# Patient Record
Sex: Female | Born: 1965 | Race: White | Hispanic: No | Smoking: Current every day smoker
Health system: Southern US, Community
[De-identification: ages and names within clinical notes are randomized; demographics above are authoritative.]

## PROBLEM LIST (undated history)

## (undated) DIAGNOSIS — I4901 Ventricular fibrillation: Secondary | ICD-10-CM

## (undated) DIAGNOSIS — R0602 Shortness of breath: Secondary | ICD-10-CM

## (undated) DIAGNOSIS — I2510 Atherosclerotic heart disease of native coronary artery without angina pectoris: Secondary | ICD-10-CM

## (undated) DIAGNOSIS — Z888 Allergy status to other drugs, medicaments and biological substances status: Secondary | ICD-10-CM

## (undated) DIAGNOSIS — J301 Allergic rhinitis due to pollen: Secondary | ICD-10-CM

## (undated) DIAGNOSIS — I10 Essential (primary) hypertension: Secondary | ICD-10-CM

## (undated) DIAGNOSIS — I6529 Occlusion and stenosis of unspecified carotid artery: Secondary | ICD-10-CM

## (undated) DIAGNOSIS — E785 Hyperlipidemia, unspecified: Secondary | ICD-10-CM

## (undated) DIAGNOSIS — Z8669 Personal history of other diseases of the nervous system and sense organs: Secondary | ICD-10-CM

## (undated) DIAGNOSIS — Z720 Tobacco use: Secondary | ICD-10-CM

## (undated) HISTORY — DX: Atherosclerotic heart disease of native coronary artery without angina pectoris: I25.10

## (undated) HISTORY — DX: Personal history of other diseases of the nervous system and sense organs: Z86.69

## (undated) HISTORY — DX: Occlusion and stenosis of unspecified carotid artery: I65.29

## (undated) HISTORY — PX: TONSILLECTOMY: SUR1361

## (undated) HISTORY — PX: ADENOIDECTOMY: SUR15

## (undated) HISTORY — DX: Shortness of breath: R06.02

## (undated) HISTORY — PX: CHOLECYSTECTOMY: SHX55

## (undated) HISTORY — DX: Allergy status to other drugs, medicaments and biological substances: Z88.8

## (undated) HISTORY — DX: Ventricular fibrillation: I49.01

## (undated) HISTORY — DX: Hyperlipidemia, unspecified: E78.5

## (undated) HISTORY — DX: Allergic rhinitis due to pollen: J30.1

## (undated) HISTORY — DX: Essential (primary) hypertension: I10

## (undated) HISTORY — PX: CARDIAC CATHETERIZATION: SHX172

## (undated) HISTORY — DX: Tobacco use: Z72.0

---

## 2009-07-24 DIAGNOSIS — I2510 Atherosclerotic heart disease of native coronary artery without angina pectoris: Secondary | ICD-10-CM

## 2009-07-24 DIAGNOSIS — I4901 Ventricular fibrillation: Secondary | ICD-10-CM

## 2009-07-24 HISTORY — PX: CORONARY ANGIOPLASTY: SHX604

## 2009-07-24 HISTORY — DX: Ventricular fibrillation: I49.01

## 2009-07-24 HISTORY — DX: Atherosclerotic heart disease of native coronary artery without angina pectoris: I25.10

## 2009-08-07 ENCOUNTER — Inpatient Hospital Stay (HOSPITAL_COMMUNITY): Admission: EM | Admit: 2009-08-07 | Discharge: 2009-08-09 | Payer: Self-pay | Admitting: Cardiology

## 2009-08-07 ENCOUNTER — Ambulatory Visit: Payer: Self-pay | Admitting: Cardiology

## 2009-08-07 ENCOUNTER — Encounter: Payer: Self-pay | Admitting: Cardiology

## 2009-08-08 ENCOUNTER — Encounter: Payer: Self-pay | Admitting: Cardiovascular Disease

## 2009-08-08 ENCOUNTER — Ambulatory Visit: Payer: Self-pay | Admitting: Cardiovascular Disease

## 2009-08-09 ENCOUNTER — Telehealth: Payer: Self-pay | Admitting: Cardiovascular Disease

## 2009-08-15 ENCOUNTER — Encounter (INDEPENDENT_AMBULATORY_CARE_PROVIDER_SITE_OTHER): Payer: Self-pay | Admitting: *Deleted

## 2009-08-15 ENCOUNTER — Telehealth: Payer: Self-pay | Admitting: Cardiovascular Disease

## 2009-08-23 ENCOUNTER — Ambulatory Visit: Payer: Self-pay | Admitting: Cardiovascular Disease

## 2009-08-26 ENCOUNTER — Telehealth: Payer: Self-pay | Admitting: Cardiovascular Disease

## 2009-08-30 ENCOUNTER — Ambulatory Visit: Payer: Self-pay | Admitting: Cardiovascular Disease

## 2009-09-09 ENCOUNTER — Telehealth: Payer: Self-pay | Admitting: Cardiovascular Disease

## 2009-09-24 ENCOUNTER — Ambulatory Visit: Payer: Self-pay | Admitting: Cardiovascular Disease

## 2009-11-06 ENCOUNTER — Telehealth: Payer: Self-pay | Admitting: Cardiovascular Disease

## 2009-12-31 ENCOUNTER — Ambulatory Visit: Payer: Self-pay | Admitting: Cardiovascular Disease

## 2010-02-09 IMAGING — CR DG CHEST 1V PORT
1 series · 1 of 1 positions shown · non-contrast
Comparison: None

CLINICAL DATA: Acute myocardial infarction

PORTABLE CHEST - 1 VIEW

[view not recorded]
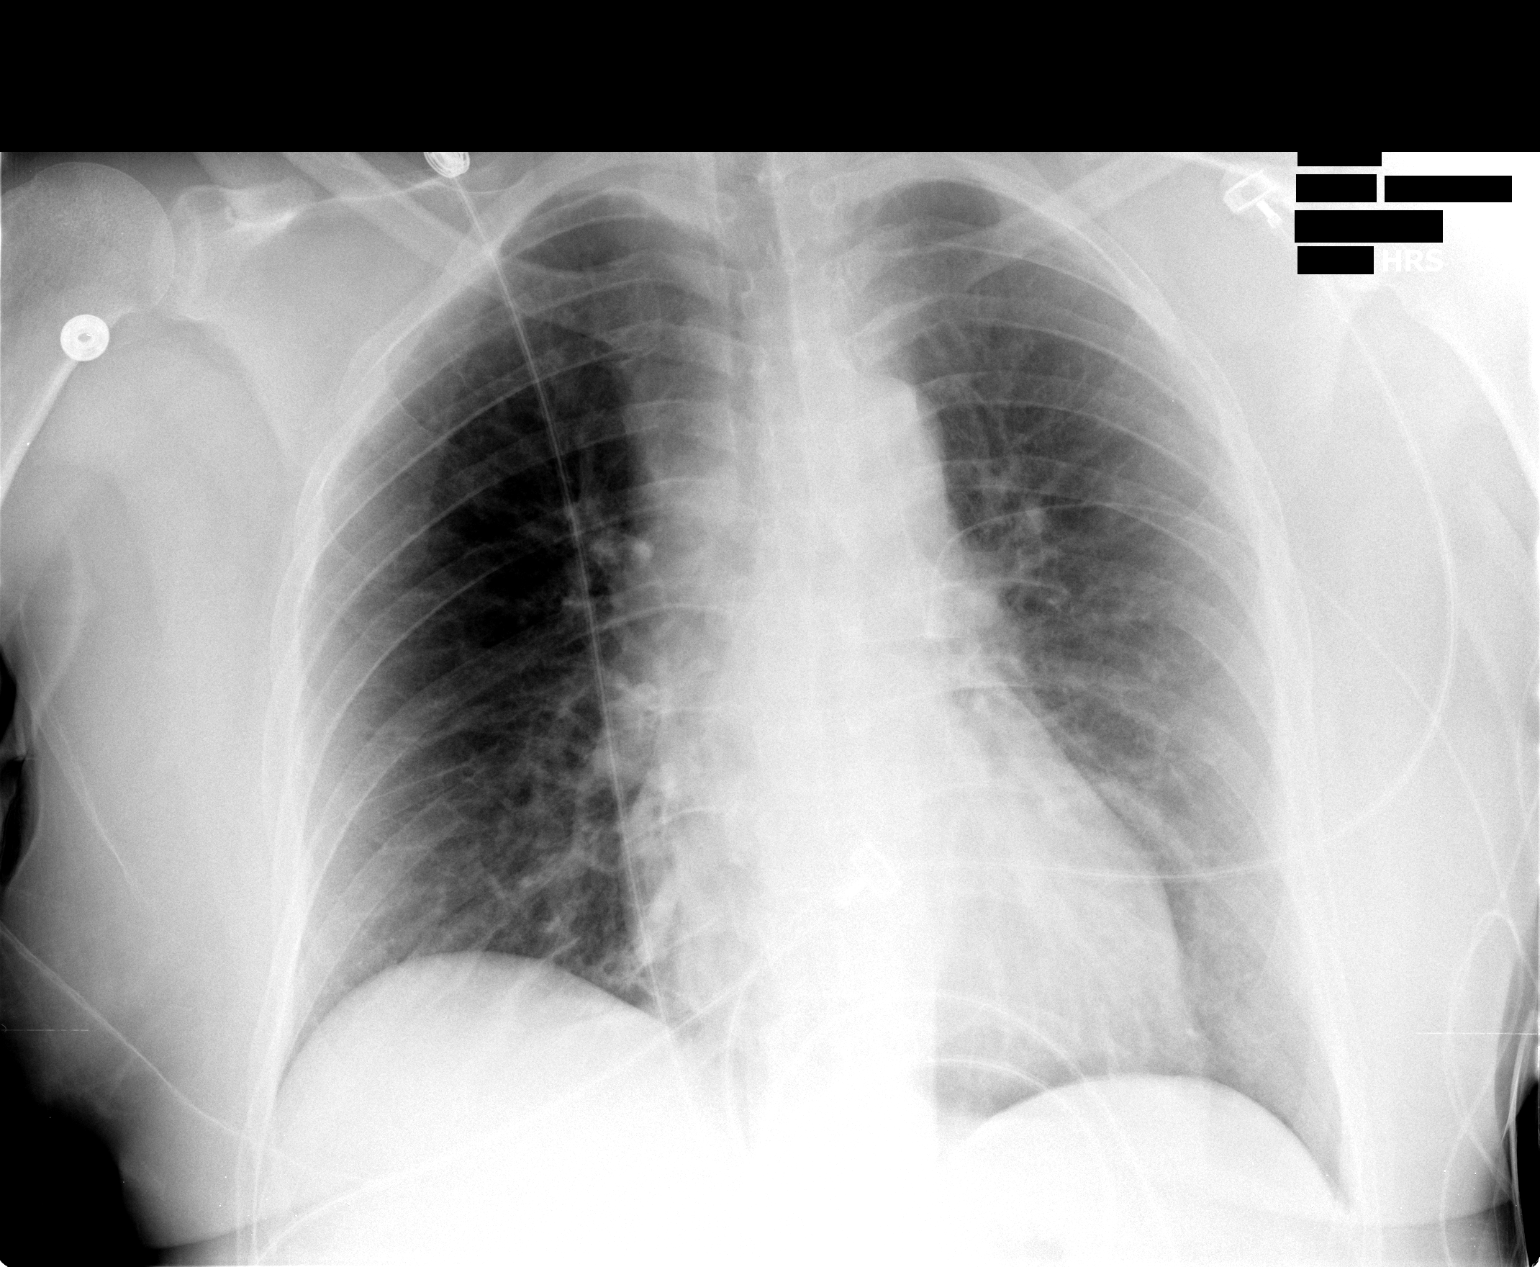

[1 of 1 positions shown; findings below may reference images not displayed]

FINDINGS: Heart and vascularity within normal limits.  There is
mild peribronchial thickening.  No active airspace disease or
pleural fluid in one-view.
IMPRESSION: Mild peribronchial thickening - no congestive heart failure or
active disease in one-view.

## 2010-02-24 ENCOUNTER — Encounter (INDEPENDENT_AMBULATORY_CARE_PROVIDER_SITE_OTHER): Payer: Self-pay | Admitting: *Deleted

## 2010-07-03 ENCOUNTER — Ambulatory Visit: Payer: Self-pay | Admitting: Cardiovascular Disease

## 2010-07-04 ENCOUNTER — Telehealth (INDEPENDENT_AMBULATORY_CARE_PROVIDER_SITE_OTHER): Payer: Self-pay | Admitting: *Deleted

## 2010-07-04 ENCOUNTER — Ambulatory Visit: Payer: Self-pay | Admitting: Cardiovascular Disease

## 2010-07-07 ENCOUNTER — Ambulatory Visit: Payer: Self-pay | Admitting: Cardiology

## 2010-07-07 ENCOUNTER — Ambulatory Visit (HOSPITAL_COMMUNITY): Admission: RE | Admit: 2010-07-07 | Discharge: 2010-07-07 | Payer: Self-pay | Admitting: Cardiology

## 2010-07-09 ENCOUNTER — Ambulatory Visit: Payer: Self-pay | Admitting: Cardiovascular Disease

## 2010-07-21 ENCOUNTER — Encounter: Payer: Self-pay | Admitting: Cardiovascular Disease

## 2010-08-13 ENCOUNTER — Telehealth: Payer: Self-pay | Admitting: Cardiovascular Disease

## 2010-09-25 ENCOUNTER — Telehealth: Payer: Self-pay | Admitting: Cardiovascular Disease

## 2010-12-23 NOTE — Progress Notes (Signed)
  Phone Note Outgoing Call   Call placed by: Dessie Coma  LPN,  August 13, 2010 11:23 AM Call placed to: Patient Summary of Call: LM with daughter to have patient call this nurse back.  Canceled appt. for Echo and ABI at [facility]. on 07/21/10.  Follow-up for Phone Call        T.C. to patient-Patient declines having this nurse reschedule  Echo and ABI at  [facility]. office for right now.  States in the process of moving and her daughter plans on rescheduling appt. when they get things settled from moving.   Follow-up by: Dessie Coma  LPN,  August 18, 2010 1:52 PM

## 2010-12-23 NOTE — Progress Notes (Signed)
----   Converted from flag ---- ---- 07/04/2010 4:11 PM, Marilynne Halsted, CMA, AAMA wrote: Pt has Medicaid only, no precert required.  ---- 07/04/2010 3:54 PM, Dessie Coma  LPN wrote: Georgiana Spinner Cath scheduled for Monday 07/07/10 Dx: unstable angina.  Dr. Riley Kill to be performing MD.  To be done   [facility].  Thanks, Victorino Dike ------------------------------

## 2010-12-23 NOTE — Progress Notes (Signed)
Summary: medication change  Phone Note Outgoing Call   Call placed by: Dessie Coma  LPN,  September 25, 2010 11:41 AM Call placed to: Patient Summary of Call: LM with daughter for patient to call this nurse back-Per Dr. Kirke Corin, does not need Effient anymore.  Needs to switch to Plavix 75mg  once daily.   Follow-up for Phone Call        Patient called back.  Notified per Dr. Kirke Corin, no need to take Effient anymore.  To switch to Plavix 75mg  once daily.  Will call Rx in.  Follow-up by: Dessie Coma  LPN,  September 25, 2010 1:38 PM

## 2010-12-23 NOTE — Miscellaneous (Signed)
Summary: update med  Clinical Lists Changes  Medications: Added new medication of LISINOPRIL 20 MG TABS (LISINOPRIL) Take one tablet by mouth daily 

## 2010-12-23 NOTE — Miscellaneous (Signed)
Summary: Appointment Canceled  Appointment status changed to canceled by [employer] on 07/21/2010 8:32 AM.  Cancellation Comments --------------------- echo/ dx 786.09/ pt has medicade/ gd  Appointment Information ----------------------- Appt Type:  CARDIOLOGY ANCILLARY VISIT      Date:  Tuesday, July 22, 2010      Time:  3:00 PM for 60 min   Urgency:  Routine   Made By:  Pearson Grippe  To Visit:  [id number]    Reason:  echo/ dx 786.09/ pt has medicade/ gd  Appt Comments ------------- -- 07/21/10 8:32: ([facility]) CANCELED -- echo/ dx 786.09/ pt has medicade/ gd -- 07/09/10 10:48: ([facility]) BOOKED -- Routine CARDIOLOGY ANCILLARY VISIT at 07/22/2010 3:00 PM for 60 min echo/ dx 786.09/ pt has medicade/ gd

## 2011-02-06 LAB — BASIC METABOLIC PANEL
Chloride: 106 mEq/L (ref 96–112)
GFR calc Af Amer: >60 mL/min (ref >60)
Potassium: 3.8 mEq/L (ref 3.5–5.1)

## 2011-02-06 LAB — CBC
HCT: 43 % (ref 36.0–46.0)
MCV: 89.4 fL (ref 78.0–100.0)
Platelets: 265 10*3/uL (ref 150–400)
RBC: 4.81 MIL/uL (ref 3.87–5.11)
WBC: 12.2 10*3/uL — ABNORMAL HIGH (ref 4.0–10.5)

## 2011-02-06 LAB — PREGNANCY, URINE: Preg Test, Ur: NEGATIVE

## 2011-02-09 ENCOUNTER — Encounter: Payer: Self-pay | Admitting: Cardiovascular Disease

## 2011-02-12 ENCOUNTER — Ambulatory Visit (INDEPENDENT_AMBULATORY_CARE_PROVIDER_SITE_OTHER): Payer: Medicaid Other | Admitting: Cardiovascular Disease

## 2011-02-12 ENCOUNTER — Encounter: Payer: Self-pay | Admitting: Cardiovascular Disease

## 2011-02-12 DIAGNOSIS — I2510 Atherosclerotic heart disease of native coronary artery without angina pectoris: Secondary | ICD-10-CM

## 2011-02-12 DIAGNOSIS — E785 Hyperlipidemia, unspecified: Secondary | ICD-10-CM | POA: Insufficient documentation

## 2011-02-12 DIAGNOSIS — Z720 Tobacco use: Secondary | ICD-10-CM

## 2011-02-12 DIAGNOSIS — F17200 Nicotine dependence, unspecified, uncomplicated: Secondary | ICD-10-CM

## 2011-02-12 DIAGNOSIS — I10 Essential (primary) hypertension: Secondary | ICD-10-CM | POA: Insufficient documentation

## 2011-02-12 NOTE — Assessment & Plan Note (Signed)
Her symptoms are under control. There has been no worsening. Will continue medical management. I have a prolonged discussion with her about importance of lifestyle changes. She is now taking Plavix daily which will be continued. She is allergic to Aspirin.

## 2011-02-12 NOTE — Assessment & Plan Note (Signed)
I strongly advised to quit smoking.

## 2011-02-12 NOTE — Progress Notes (Signed)
HPI  This is a follow up visit. She has been doing reasonably well. Her chest pain is not frequent and has improved since last visit. She continues to smoke and has significant exertional dyspnea. She doesn't exercise and follows a sedentary lifestyle.   Allergies  Allergen Reactions  . Adult Strength Aspirin (Aspirin-Caffeine) Hives  . Penicillins      Current Outpatient Prescriptions on File Prior to Visit  Medication Sig Dispense Refill  . Albuterol Sulfate (VENTOLIN HFA IN) Inhale 2 puffs into the lungs daily.        Marland Kitchen buPROPion (WELLBUTRIN XL) 150 MG 24 hr tablet Take 150 mg by mouth daily.        . butalbital-acetaminophen-caffeine (FIORICET WITH CODEINE) 50-325-40-30 MG per capsule Take 1 capsule by mouth every 4 (four) hours as needed.        . ezetimibe (ZETIA) 10 MG tablet Take 10 mg by mouth daily.        Marland Kitchen lisinopril (PRINIVIL,ZESTRIL) 20 MG tablet Take 20 mg by mouth daily.        . medroxyPROGESTERone (DEPO-PROVERA) 150 MG/ML injection Inject 150 mg into the muscle every 3 (three) months.        . metoprolol (LOPRESSOR) 50 MG tablet Take 50 mg by mouth 2 (two) times daily. To take 11/2 tablets twice daily.      . nitroGLYCERIN (NITROSTAT) 0.4 MG SL tablet Place 0.4 mg under the tongue every 5 (five) minutes as needed.        Marland Kitchen omeprazole (PRILOSEC) 40 MG capsule Take 40 mg by mouth daily.        . prasugrel (EFFIENT) 10 MG TABS Take 10 mg by mouth daily.        . rosuvastatin (CRESTOR) 40 MG tablet Take 40 mg by mouth daily.        Marland Kitchen aspirin-acetaminophen-caffeine (EXCEDRIN MIGRAINE) 250-250-65 MG per tablet Take 1 tablet by mouth every 6 (six) hours as needed.        . chlorpheniramine (CHLOR-TRIMETON) 4 MG tablet Take 4 mg by mouth as needed.        . venlafaxine (EFFEXOR) 75 MG tablet Take 75 mg by mouth daily. To take 4 tablets every day for depression          Past Medical History  Diagnosis Date  . Aspirin allergy   . Diabetes mellitus   . History of migraine  headaches   . Tobacco user   . Hay fever   . SOB (shortness of breath)   . Cardiac arrest - ventricular fibrillation Sept 2010    x 2 enroute to the catherization lab  . Coronary artery disease sept,2010    s/p acute inferior ST elevation MI  . Hyperlipidemia   . Hypertension      Past Surgical History  Procedure Date  . Cardiac catheterization Sept 2010, Aug 2011    Most recent cath showed an occluded RCA at previously placed stents.   . Coronary angioplasty 07/2009    EF of 45%-underwent 3 bare-metal stent placements     Family History  Problem Relation Age of Onset  . Brain cancer Father 62  . Heart attack Brother 56     History   Social History  . Marital Status: Divorced    Spouse Name: N/A    Number of Children: N/A  . Years of Education: N/A   Occupational History  . Not on file.   Social History Main Topics  . Smoking status: Current  Everyday Smoker -- 0.5 packs/day    Types: Cigarettes  . Smokeless tobacco: Not on file  . Alcohol Use: No  . Drug Use: No  . Sexually Active:    Other Topics Concern  . Not on file   Social History Narrative  . No narrative on file     ROS Constitutional: Negative for fever, chills, diaphoresis, activity change, appetite change and fatigue.  HENT: Negative for hearing loss, nosebleeds, congestion, sore throat, facial swelling, drooling, trouble swallowing, neck pain, voice change, sinus pressure and tinnitus.  Eyes: Negative for photophobia, pain, discharge and visual disturbance.  Respiratory: Negative for apnea, cough, chest tightness, shortness of breath and wheezing.  Cardiovascular: Negative for chest pain, palpitations and leg swelling.  Gastrointestinal: Negative for nausea, vomiting, abdominal pain, diarrhea, constipation, blood in stool and abdominal distention.  Genitourinary: Negative for dysuria, urgency, frequency, hematuria and decreased urine volume.  Musculoskeletal: Negative for myalgias, back pain,  joint swelling, arthralgias and gait problem.  Skin: Negative for color change, pallor, rash and wound.  Neurological: Negative for dizziness, tremors, seizures, syncope, speech difficulty, weakness, light-headedness, numbness and headaches.  Psychiatric/Behavioral: Negative for suicidal ideas, hallucinations, behavioral problems and agitation. The patient is not nervous/anxious.     PHYSICAL EXAM   BP 119/77  Pulse 70  Wt 189 lb 6.4 oz (85.911 kg)  SpO2 97%  Constitutional: He is oriented to person, place, and time. He appears well-developed and well-nourished. No distress.  HENT:  Head: Normocephalic and atraumatic.  Eyes: Pupils are equal, round, and reactive to light. Right eye exhibits no discharge. Left eye exhibits no discharge.  Neck: Normal range of motion. Neck supple. No JVD present. No thyromegaly present.  Cardiovascular: Normal rate, regular rhythm, normal heart sounds and intact distal pulses. Exam reveals no gallop and no friction rub.  No murmur heard.  Pulmonary/Chest: Effort normal and breath sounds normal. No stridor. No respiratory distress. He has no wheezes. He has no rales. He exhibits no tenderness.  Abdominal: Soft. Bowel sounds are normal. He exhibits no distension. There is no tenderness. There is no rebound and no guarding.  Musculoskeletal: Normal range of motion. He exhibits no edema and no tenderness.  Neurological: He is alert and oriented to person, place, and time. Coordination normal.  Skin: Skin is warm and dry. No rash noted. He is not diaphoretic. No erythema. No pallor.  Psychiatric: He has a normal mood and affect. His behavior is normal. Judgment and thought content normal.         ASSESSMENT AND PLAN

## 2011-02-12 NOTE — Assessment & Plan Note (Signed)
Continue treatment with Crestor. 

## 2011-02-12 NOTE — Assessment & Plan Note (Signed)
BP is well controlled 

## 2011-02-27 LAB — BASIC METABOLIC PANEL
BUN: 6 mg/dL (ref 6–23)
Calcium: 8.5 mg/dL (ref 8.4–10.5)
Chloride: 103 mEq/L (ref 96–112)
Creatinine, Ser: 0.77 mg/dL (ref 0.4–1.2)
Creatinine, Ser: 0.78 mg/dL (ref 0.4–1.2)
GFR calc Af Amer: >60 mL/min (ref >60)
GFR calc non Af Amer: >60 mL/min (ref >60)
Glucose, Bld: 104 mg/dL — ABNORMAL HIGH (ref 70–99)
Potassium: 3.4 mEq/L — ABNORMAL LOW (ref 3.5–5.1)
Potassium: 3.8 mEq/L (ref 3.5–5.1)

## 2011-02-27 LAB — CBC
HCT: 43.2 % (ref 36.0–46.0)
Hemoglobin: 14.6 g/dL (ref 12.0–15.0)
MCHC: 33.9 g/dL (ref 30.0–36.0)
MCV: 89.5 fL (ref 78.0–100.0)
Platelets: 245 10*3/uL (ref 150–400)
RBC: 4.78 MIL/uL (ref 3.87–5.11)
RDW: 13.9 % (ref 11.5–15.5)
WBC: 14.5 10*3/uL — ABNORMAL HIGH (ref 4.0–10.5)

## 2011-02-27 LAB — POCT I-STAT, CHEM 8
BUN: 5 mg/dL — ABNORMAL LOW (ref 6–23)
Calcium, Ion: 1.03 mmol/L — ABNORMAL LOW (ref 1.12–1.32)
Chloride: 107 mEq/L (ref 96–112)
Glucose, Bld: 158 mg/dL — ABNORMAL HIGH (ref 70–99)

## 2011-02-27 LAB — COMPREHENSIVE METABOLIC PANEL
ALT: 15 U/L (ref 0–35)
Alkaline Phosphatase: 94 U/L (ref 39–117)
BUN: 6 mg/dL (ref 6–23)
CO2: 17 mEq/L — ABNORMAL LOW (ref 19–32)
Calcium: 7.6 mg/dL — ABNORMAL LOW (ref 8.4–10.5)
GFR calc non Af Amer: >60 mL/min (ref >60)
Glucose, Bld: 179 mg/dL — ABNORMAL HIGH (ref 70–99)
Sodium: 134 mEq/L — ABNORMAL LOW (ref 135–145)

## 2011-02-27 LAB — GLUCOSE, CAPILLARY
Glucose-Capillary: 100 mg/dL — ABNORMAL HIGH (ref 70–99)
Glucose-Capillary: 107 mg/dL — ABNORMAL HIGH (ref 70–99)
Glucose-Capillary: 86 mg/dL (ref 70–99)
Glucose-Capillary: 99 mg/dL (ref 70–99)

## 2011-02-27 LAB — LIPID PANEL
Cholesterol: 170 mg/dL (ref 0–200)
HDL: 23 mg/dL — ABNORMAL LOW (ref >39)

## 2011-02-27 LAB — CARDIAC PANEL(CRET KIN+CKTOT+MB+TROPI)
CK, MB: 12.4 ng/mL — ABNORMAL HIGH (ref 0.3–4.0)
CK, MB: 194 ng/mL — ABNORMAL HIGH (ref 0.3–4.0)
Total CK: 1055 U/L — ABNORMAL HIGH (ref 7–177)
Total CK: 1987 U/L — ABNORMAL HIGH (ref 7–177)
Troponin I: 39.7 ng/mL (ref 0.00–0.06)

## 2011-02-27 LAB — MAGNESIUM: Magnesium: 2.2 mg/dL (ref 1.5–2.5)

## 2011-02-27 LAB — PROTIME-INR: INR: 1.4 (ref 0.00–1.49)

## 2011-02-27 LAB — TSH: TSH: 0.879 u[IU]/mL (ref 0.350–4.500)

## 2011-02-27 LAB — HEMOGLOBIN A1C: Hgb A1c MFr Bld: 5.7 % (ref 4.6–6.1)

## 2011-04-07 NOTE — Assessment & Plan Note (Signed)
[facility]                        [place] CARDIOLOGY OFFICE NOTE   RAMAH, LANGHANS                       MRN:          [id number]  DATE:08/23/2009                            DOB:          [birth year]    CHIEF COMPLAINT:  History of myocardial infarction.   HISTORY OF PRESENT ILLNESS:  Ms. Clendenning is a 45 year old white female  with past medical history significant for a long-term tobacco use,  hypertension, hyperlipidemia and migraine headaches, who was recently  discharged from [facility] after presenting with an inferior  STEMI.  The patient states she presented to EMS with a 1-day history of  intermittent chest discomfort and tingling in her bilateral arms.  En  route, the patient evidently had several episodes of ventricular  fibrillation which required defibrillation.  She was taken to the  cardiac catheterization lab emergently where they found a completely  occluded distal right coronary artery.  The lesion was treated with a  2.5 x 14 MicroDriver stent.  There is a potential edge dissection and  another 2.5 x 24 MicroDriver stent was placed.  There is a subsequent  proximal edge dissection which was treated with an additional 2.5 x 12-  mm MicroDriver stent with resultant TIMI-III flow.  There was a 0%  residual stenosis.  During the catheterization, a 30-50% proximal LAD  lesion was found, 20-30% left main lesion.  She is found to have an  ejection fraction of 45-50%.  The patient tolerated the procedure well.  The patient had an ASPIRIN allergy, so She was placed on prasugrel  instead of Plavix.  Since her hospital discharge, the patient states  that she has been having occasional chest pain that she states happens  approximately every other day.  It is promptly relieved with  nitroglycerin.  She also states that she has been experiencing episodes  of diaphoresis that perhaps last approximately 10 minutes at a time.  She  thinks that since the procedure that these symptoms have been  improving slightly.  She denies any issues with bleeding.   PAST MEDICAL HISTORY:  As above in HPI.   FAMILY HISTORY:  Positive for premature coronary artery disease.   SOCIAL HISTORY:  The patient has smoked cigarettes since she was 45  years old and continues to smoke half a pack a day.  No alcohol use.  No  drug use.   ALLERGIES:  ASPIRIN.  The patient states when she was 16, she took  aspirin for the first time and felt some blisters in her mouth and had  some bleeding in her mouth.  She also think she had hives on her arms at  that time.  The patient is also allergic to PENICILLIN.  She is unsure  of the reaction to this medication.   MEDICATIONS:  She is currently taking include;  1. Effient 10 mg daily.  2. Metoprolol 50 mg b.i.d.  3. Lisinopril 20 mg daily.  4. Crestor 40 mg, which was increased from 20 during her hospital      stay.  5. Venlafaxine 225 mg a day.  6.  Ventolin inhaler p.r.n.  7. Depo-Provera every 3 months.  8. She also takes Fioricet p.r.n. for migraine headaches.   REVIEW OF SYSTEMS:  The patient endorses some occasional dizziness,  occasional wheezing secondary to smoking, occasional swelling in the  ankles, occasional migraine headaches, occasional seasonal allergies.   PHYSICAL EXAMINATION:  VITAL SIGNS:  Blood pressure 112/72, heart rate  is 73, she weighs 176 pounds, and sating 96% on room air.  GENERAL:  No acute distress.  HEENT:  Nonfocal.  NECK:  Supple.  There is no JVD.  There are no carotid bruits.  HEART:  Regular rate and rhythm without murmur, rub, or gallop.  LUNGS:  Clear bilaterally.  ABDOMEN:  Soft, nontender, nondistended.  Her right groin has a 2+  femoral pulse.  There is no femoral bruit.  There is no ecchymoses.  EXTREMITIES:  Without edema.  SKIN:  Warm and dry.  NEURO:  Nonfocal.  PSYCHIATRIC:  The patient is appropriate.  Normal levels of insight.   MUSCULOSKELETAL:  The patient has 5/5 bilateral upper and lower  extremity strength.   EKG today independently interpreted by myself demonstrates normal sinus  rhythm with inferior T-wave inversion.  Review of the patient's labs in  the hospital revealed a BMP completely within normal limits with a  creatinine of 0.77, her TSH was 0.879, troponin was 39.7, CK of 1055,  triglycerides 153, HDL 23, LDL 116, total cholesterol 170.  White count  15, hemoglobin 13, platelet count 245.   ASSESSMENT:  This is a 45 year old white female who is status post  inferior ST-segment elevation myocardial infarction with 3 bare-metal  stents placed in her right coronary artery.  She had an ejection  fraction of 45-50% at that time and now her symptoms consistent with [place] Heart Association class I.  Unfortunately, she has an ASPIRIN  allergy and is currently on Effient, which she will take for at least 1  month.   PLAN:  The patient should continue on her current medical therapy.  Her  LDL is not at goal during the hospital; however, her Crestor has been  increased since then.  Her blood pressure is at goal today.  She is to  continue on her Effient.  We will consider a referral to an allergist  for aspirin desensitization in the future.  Should the patient's  intermittent chest discomfort and diaphoresis continue or worsen, we  will consider repeating an ischemia evaluation likely initially in the  form of a stress test.  We will see the patient back in 29-month time, at  which time we will  recheck her fasting lipids, TSH, and repeat an echocardiogram to  evaluate for any change in  her left ventricular systolic function.  She is instructed to call our  office in the interim if any problems were to develop.     Brayton El, MD  Electronically Signed    SGA/MedQ  DD: 08/23/2009  DT: 08/24/2009  Job #: [id number]

## 2011-04-07 NOTE — Assessment & Plan Note (Signed)
[facility]                        [place] CARDIOLOGY OFFICE NOTE   Raven Rios, Raven Rios                       MRN:          [id number]  DATE:07/04/2010                            DOB:          [birth year]    This is a followup visit from yesterday.   PROBLEMS LIST:  1. Coronary artery disease status post acute inferior ST elevation      myocardial infarction in September 2010.  It was complicated by      ventricular fibrillation arrest twice en route to the Cardiac      Catheterization Lab.  Cardiac catheterization showed an ejection      fraction of 45% with subtotally occlusion of the distal right      coronary artery.  She underwent angioplasty and bare-metal stent      placement at that time which was complicated by proximal edge      dissection which required addition of 2 more bare-metal stents.      The stents were 2.5 mm in diameter.  The rest of the coronary      anatomy showed 20-30% distal left main stenosis and 30-50% proximal      left anterior descending coronary artery stenosis.  2. History of aspirin allergy resulting in hives.  3. Hyperlipidemia.  4. Hypertension.  5. Tobacco use.  6. History of migraine headaches.   INTERVAL HISTORY:  I saw [name] yesterday for urgent evaluation for  chest pain.  She was complaining of chest tightness which has been  happening with minimal activities and was relieved with nitroglycerin.  However, she was also having some wheezing and dyspnea and some of her  symptoms sounded respiratory in nature.  Her EKG showed no acute  changes.  I decided to proceed with an urgent stress test which was done  today at [facility].  The stress test came back abnormal with  evidence of inferior ischemia with ischemic EKG changes as well.  The  full report is still not available to me and this was communicated to [place]  by the performing cardiologist there.  The patient did have some chest  pain today,  but again is responsive to nitroglycerin.  She is having  some dyspnea as well.  She denies palpitations, syncope, or presyncope.   CURRENT MEDICATIONS:  1. Crestor 40 mg daily.  2. Metoprolol 50 mg twice daily.  3. Lisinopril 20 mg daily.  4. Effient 10 mg daily.  5. Insulin as needed.  6. Depo-Provera 150 mg every 3 months.  7. Omeprazole 40 mg daily.  8. Bupropion 150 mg once daily.  9. Zetia 10 mg once daily.   ALLERGIES:  Include PENICILLIN as well as ASPIRIN which causes hives.   PHYSICAL EXAMINATION:  GENERAL:  The patient appears to be in no acute  distress.  VITAL SIGNS:  Reveal weight of 188 pounds, blood pressure is 128/74,  pulse is 82, and oxygen saturation is 97% on room air.  NECK:  Reveals no JVD or carotid bruits.  LUNGS:  Clear to auscultation.  HEART:  Regular rate and rhythm with no gallops  or murmurs.  ABDOMEN:  Benign, nontender, nondistended.  EXTREMITIES:  With no clubbing, cyanosis, or edema.   IMPRESSION:  1. Unstable angina:  The patient started having chest pain yesterday      which has been intermittent.  She had some discomfort even at rest,      but her chest pain has been responsive to nitroglycerin.  Stress      test from this morning which was Lexiscan nuclear test showed      evidence of inferior ischemia with ischemic EKG changes with      Lexiscan.  I had a prolonged discussion with the patient and I      advised her to be admitted to the hospital for monitoring and      cardiac catheterization on Monday.  However, the patient does not      want to be admitted to the hospital due to personal issues at home      that she needs to take care of.  I will go ahead and schedule her      for an outpatient cardiac catheterization on Monday.  In the      meanwhile, I explained to her to call 911 if she gets chest pain      that is not responsive to nitroglycerin.  She did have her troponin      checked while she was at [facility] which came back  normal.  This      patient is allergic to ASPIRIN and has been on Effient since her      angioplasty last year.  Obviously, she is at risk for in-stent      restenosis due to relatively small size bare-metal stents in the      right coronary artery.  If she has significant in-stent restenosis,      the best option might be cutting balloon if possible and avoidance      of a drug-eluting stent unless the restenosis is diffuse.  In the      future, I think it might be worth in this patient to refer her to      an allergist for Aspirin desensitization.  She most likely will      require lifelong dual antiplatelet therapy.  2. Hyperlipidemia.  We will continue with high-dose Crestor and Zetia.  3. Hypertension.  Blood pressure is well controlled.  4. Tobacco use.  The patient was counseled yesterday regarding smoking      cessation.     Lorine Bears, MD  Electronically Signed    MA/MedQ  DD: 07/04/2010  DT: 07/05/2010  Job #: [id number]

## 2011-04-07 NOTE — Assessment & Plan Note (Signed)
[facility] [place] [facility]                        [place] CARDIOLOGY OFFICE NOTE   BRENDOLYN, STOCKLEY                       MRN:          [id number]  DATE:08/30/2009                            DOB:          1966/03/10    HISTORY OF PRESENT ILLNESS:  Ms. Oyster is a 45 year old white female,  who presented with an acute MI earlier in September complicated by  multiple episodes of ventricular fibrillation requiring defibrillation,  who subsequently had a 3 bare-metal stents placed in a right coronary  artery.  The initial stent was complicated by its dissection.  Her  ejection fraction at that time was 45%.  The patient states that since  her procedure, she has been having intermittent chest discomfort and  diaphoresis.  The chest discomfort is reproducible.  The patient states  the diaphoresis is occurring at several times a day and it is sometimes  related to activity.  She denies any chest discomfort other than the  reproducible discomfort on the upper portion of her sternum.  She states  that this is varied during the chest discomfort she had when she  presented with her myocardial infarction.  Other than the above  problems, she has been in a normal state of health.   PHYSICAL EXAMINATION:  VITAL SIGNS:  Blood pressure is 116/76, pulse is  67, she weighs 172 pounds, sating 96% on room air.  GENERAL:  No acute distress.  HEENT:  Nonfocal.  HEART:  Regular rate and rhythm without murmur, rub, or gallop.  Distant  heart sounds.  LUNGS:  Clear bilaterally.  ABDOMEN:  Soft, nontender, nondistended.  EXTREMITIES:  Without edema.  CHEST WALL.  There is clear tenderness to palpation over the upper  portion of the sternum.   EKG from today independently interpreted by myself demonstrates normal  sinus rhythm with inferior T-wave inversion that is unchanged from EKG  dated August 23, 2009.   ASSESSMENT AND PLAN:  A 45 year old, white female, status post  inferior  ST-segment elevation myocardial infarction with subsequent percutaneous  intervention presenting with continuing intermittent diaphoresis.  It is  unclear whether the diaphoresis is secondary to coronary artery disease.  Next week, we will proceed with a stress echocardiogram.  Based on the  results of this finding, we will decide whether a repeat left heart  catheterization is in order.  The patient is instructed that if the  diaphoresis were not to stop or if she were to develop any chest  discomfort, other than reproducible type she is currently experiencing,  that she should contact 911 or contact our office.  In the interim, she  should take Tylenol for the reproducible chest discomfort that she has  been recently experiencing.     Brayton El, MD  Electronically Signed    SGA/MedQ  DD: 08/30/2009  DT: 08/31/2009  Job #: [id number]

## 2011-04-07 NOTE — Assessment & Plan Note (Signed)
[facility]                        [place] CARDIOLOGY OFFICE NOTE   Raven Rios, Raven Rios                       MRN:          [id number]  DATE:07/09/2010                            DOB:          1966/07/08    This is a followup visit after recent cardiac catheterization.   PROBLEM LIST:  1. Coronary artery disease status post acute inferior ST elevation      myocardial infarction in September 2010.  It was complicated by      ventricular fibrillation arrest twice en route to the cardiac      catheterization lab.  Cardiac catheterization at that time showed      an ejection fraction of 45% with subtotally occluded distal right      coronary artery.  She underwent angioplasty and 3 bare-metal stent      placements at that time which was done due to proximal edge      dissection.  Most recent cardiac catheterization in August of 2011,      showed an occluded right coronary artery stents with faint left-to-      right collaterals and 40% proximal left anterior descending      coronary artery disease.  2. History of ASPIRIN allergy.  3. Hyperlipidemia.  4. Hypertension.  5. Tobacco use.  6. Type 2 diabetes.  7. History of migraine headaches.   INTERVAL HISTORY:  The patient had cardiac catheterization done by Dr.  Riley Kill which showed that the right coronary artery is occluded at the  previously placed bare-metal stents.  There was faint collaterals from  left-to-right.  I agree that an intervention would not have a good  outcome in this situation given the long occlusion and 3 bare-metal  stents, the small diameter vessel and also history of diabetes.  Medical  therapy is recommended.  She continues to have some chest pain, but this  seems to be improving.  She is trying to quit smoking.  She has been  taking her medications regularly.   Medications include:  1. Crestor 40 mg daily.  2. Metoprolol 50 mg twice daily.  3. Lisinopril 20 mg once  daily.  4. Effient 10 mg daily.  5. Insulin.  6. Depo-Provera 150 mg every 3 months.  7. Omeprazole 40 mg daily.  8. Bupropion 150 mg once daily.  9. Zetia 10 mg once daily.   PHYSICAL EXAMINATION:  GENERAL:  Overall, she appears to be in no acute  distress.  VITAL SIGNS:  Weight is 188.4 pounds, blood pressure is 135/83, pulse is  73, and oxygen saturation is 98% on room air.  NECK:  No JVD or carotid bruits.  LUNGS:  Clear to auscultation.  HEART:  Regular rate and rhythm with no gallops or murmurs.  ABDOMEN:  Benign, nontender, nondistended.  EXTREMITIES:  With no clubbing, cyanosis, or edema.  Left groin area has  no swelling or hematoma.  There is small bruise.  Femoral pulse slightly  diminished on the right side at +1, also dorsalis pedis is diminished on  the right-side +1.   IMPRESSION:  1. Coronary artery disease status  post inferior myocardial infarction:      Recent cardiac catheterization showed occluded bare-metal stents in      the right coronary artery likely due to significant restenosis.      The distal vessel is relatively small in size and it is getting      collaterals from the left.  I agree that percutaneous intervention      is not warranted.  Medical therapy is her best option at this time.      I discussed with her extensively the importance of controlling her      risk factors especially her smoking status and diabetes.  Her blood      pressure and hyperlipidemia have been under good control overall.      In the meantime, we will continue with Effient, given that she is      allergic to ASPIRIN.  This can be switched in the future to Plavix      if needed.  We will continue with metoprolol, lisinopril, and      Crestor.  We will consider adding long-acting nitrate, if her chest      pain does not improve.  We can even consider adding Ranexa if      needed.  2. Hyperlipidemia.  We will continue with Crestor and Zetia.  3. Hypertension.  Blood pressure  is well controlled.  4. The patient is complaining of cramps and discomfort in her right      leg with activities.  Her pulses are slightly diminished on that      side.  Thus, we will request an ABI for further evaluation.  Also      during the cardiac catheterization, an LV gram was not performed      and for that reason, we will obtain an echocardiogram to evaluate      her left ventricular systolic function and also if there is any      mitral regurgitation.  The patient will follow up after these test.     Raven Bears, MD  Electronically Signed    MA/MedQ  DD: 07/09/2010  DT: 07/10/2010  Job #: [id number]

## 2011-04-07 NOTE — Assessment & Plan Note (Signed)
[name]                        [place] CARDIOLOGY OFFICE NOTE   Raven Rios, Raven Rios                       MRN:          [id number]  DATE:09/24/2009                            DOB:          Apr 07, 1966    PROBLEM LIST:  1. Coronary artery disease.  The patient is status post acute MI in      September of this year, complicated by multiple episodes of VFib      that required 3 bare-metal stents in her right coronary artery.      The procedure was complicated by dissection.  Her ejection fraction      was 45% at that time.  The patient has since had a stress test      dated September 05, 2009 that showed no evidence of ischemia and an      ejection fraction of 51%.  She also had a transthoracic      echocardiogram that showed a normal ejection fraction and no      significant regurgitation, that was dated September 17, 2009.  2. Hyperlipidemia.  Lipid profile dated September 23, 2009 showed an LDL      of 110, HDL 33, triglycerides 125, and total cholesterol 168 that      was on Crestor 40 mg daily.  3. Hypertension.  4. Ongoing tobacco use.  5. Migraine headaches.   INTERVAL HISTORY:  The patient states since her last visit, she has been  getting along fairly well.  She is about to engage in cardiac  rehabilitation program.  The patient states that at one of her  preliminary visits, they told her that her blood pressure was too high  at that time to start cardiac rehab.  Of note, she has not been taking  her lisinopril as she has lost this medication and is due for another  refill on the 12th of this month.  The patient denies any chest  discomfort other than the chest discomfort she has been experiencing  when she coughs as she has had a cold recently.  The patient complains  of some lower back pain and some right lower leg pain, but denies any  significant dyspnea on exertion or lower extremity edema.  She is  compliant with her medications.  She  continues to have some episodes of  diaphoresis almost on a daily basis.   PHYSICAL EXAMINATION:  VITAL SIGNS:  She has a blood pressure 118/80 and  her pulse is 70.  She weighs 176 pounds and she is sating 97% on room  air.  GENERAL:  No acute distress.  HEENT:  Nonfocal.  NECK:  Supple.  HEART:  Regular rate and rhythm without murmur, rub, or gallop.  LUNGS:  Clear bilaterally.  ABDOMEN:  Soft, nontender, and nondistended.  EXTREMITIES:  Without edema.  SKIN:  Warm and dry.   Review of the patient's labs, the cholesterol profile as above in  problem list.  Her CMP dated September 23, 2009 was completely within  normal limits.   ASSESSMENT AND PLAN:  1. Coronary artery disease.  The patient should continue on Effient,  beta-blocker, and ACE inhibitor.  2. Hypertension.  Her blood pressure is at acceptable level.  If she      is told that she has hypertension, again in the cardiac rehab      program we would consider secondary causes of hypertension and      likely order a renal duplex study.  3. Hyperlipidemia.  The patient's LDL is not at goal and she is taking      Crestor 40 mg daily.  Today, we will start therapy with Zetia 10 mg      daily.  4. Aspirin allergy.  The patient appears to have had a true aspirin      allergy and that she thinks that she had hives and some blistering      when she took it many many years ago.  She should continue on      Effient for now.  However, we should consider aspirin      desensitization at some point in the future.  5. Episodes of diaphoresis.  I do not believe that these are cardiac      in origin.  The patient also has a free T4 and a TSH that are      within normal limits, making thyroid disease highly unlikely.  She      should speak with a primary care physician or gynecologist,      possible hormonal imbalance may be the etiology.  6. The patient has a history of depression that was also picked up in      the screening by  cardiac rehabilitation.  She continues on      venlafaxine as prescribed by her primary care physician.  Recently,      he also wrote her for bupropion.  The patient was unclear whether      or not should be taking that and the venlafaxine.  We recommend she      not to start the bupropion, so she clarifies this with her primary      care physician.  I will see the patient back in 3 months' time, at      which time we will recheck a fasting lipid profile.  If she has any      paroxysms of hypertension before, then she is instructed to contact      our office.     Brayton El, MD  Electronically Signed    SGA/MedQ  DD: 09/24/2009  DT: 09/25/2009  Job #: [id number]

## 2011-04-07 NOTE — Assessment & Plan Note (Signed)
[facility]                        [place] CARDIOLOGY OFFICE NOTE   Raven Rios, Raven Rios                       MRN:          [id number]  DATE:07/03/2010                            DOB:          1966/06/01    This is an urgent visit for evaluation of chest pain.   PROBLEM LIST:  1. Coronary artery disease, status post acute inferior ST elevation      myocardial infarction in September 2010.  She had ventricular      fibrillation arrest times twice at that time on the way to the cath      lab.  Cardiac catheterization showed an ejection fraction of 45%      with subtotally occlusion of the distal right coronary artery.  She      underwent bare-metal stent placement at that time which was      complicated by proximal H. dissection which required addition of 2      more stents in the right coronary artery.  The stents were 2.5 mm      in diameter.  The rest of the coronary anatomy showed 20-30% distal      left main stenosis, the left anterior descending had 30-50%      proximal stenosis.  2. Hyperlipidemia.  3. Hypertension.  4. Tobacco use.  5. History of migraine headaches.   INTERVAL HISTORY:  The patient started having some chest tightness  yesterday which was substernal with no radiation.  It responded to 3  nitroglycerin.  She had some more later in the day and she still has  some low-grade chest tightness at this time.  According to her, it is  different from her previous myocardial infarction.  She is also having  some cough and wheezing and continues to smoke.  She is not sure of her  symptoms are cardiac or respiratory.  Since her myocardial infarction in  September 2010, she did have recurrent episodes of chest pain which  required investigation with a stress test.  She also tells me that about  2-3 months ago she was taken to a local emergency room for chest pain  and was discharged after her basic workup was unremarkable.   CURRENT  MEDICATIONS:  1. Crestor 40 mg daily.  2. Metoprolol 50 mg twice daily.  3. Lisinopril 20 mg daily.  4. Effient 10 mg daily.  5. Ventolin as needed.  6. Depo Provera 150 mg every 3 months.  7. Omeprazole 40 mg daily.  8. Bupropion 150 mg once daily.  9. Zetia 10 mg once daily.   ALLERGIES:  PENICILLIN.  She is also allergic to ASPIRIN which causes  hives.   PHYSICAL EXAMINATION:  GENERAL:  Overall she appears to be in no acute  distress.  She is well nourished.  VITAL SIGNS:  Blood pressure is 144/90, pulse is 70, her weight is 185.6  pounds.  HEENT:  Normocephalic and atraumatic.  NECK:  There is no JVD, masses, or carotid bruits.  RESPIRATORY:  Normal respiratory effort with no use of accessory  muscles.  Auscultation reveals normal breath sounds with no  crackles or  wheezing.  CARDIOVASCULAR:  Normal PMI.  Normal S1 and S2 with no gallops or  murmurs.  ABDOMEN:  Benign, nontender, nondistended.  EXTREMITIES:  No clubbing, cyanosis, or edema.  SKIN:  No rash.  PSYCHIATRIC:  She is alert, oriented x3 with normal mood and affect.   Electrocardiogram was done which showed normal sinus rhythm with  slightly inverted T-waves in the inferior leads.  These changes are less  prominent than her previous ECG.   IMPRESSION:  1. Chest pain:  The patient has a known history of coronary artery      disease manifested by an inferior myocardial infarction followed by      an emergent angioplasty and three bare-metal stents placement to      relatively small size right coronary artery.  Due to her allergy to      ASPIRIN, she has been on Effient daily.  She is having chest pain      which seems to be different in quality to her previous myocardial      infarction.  This might be respiratory in nature given her      continued smoking.  Her current EKG showed that the inferior T-wave      changes are less prominent than before.  Obviously, she is at      significant risk for in-stent  restenosis and due to her previous      cardiac history, I recommend urgent evaluation with a stress test.      She is unable to exercise in the treadmill due to chronic fatigue      and inability to get her heart rate up.  Thus, I recommend      proceeding with a Lexiscan nuclear stress test which will arrange      for tomorrow.  In the meantime, I instructed her to go to the      emergency room if her chest pain does not resolve with      nitroglycerin.  We will continue with current cardiac medications.  2. Hypertension.  Her blood pressure is slightly above target likely      due to her anxiety.  3. Hyperlipidemia.  Most recent lipid profile showed improvement with      a total cholesterol of 156, triglyceride 136, LDL of 92 and then      HDL of 37.  We will continue with Crestor and Zetia.  4. Tobacco use:  She was again counseled about smoking cessation.      According to her, she has not been able to quit smoking in spite of      using Chantix and nicotine patch.     Lorine Bears, MD  Electronically Signed    MA/MedQ  DD: 07/03/2010  DT: 07/04/2010  Job #: [id number]

## 2011-04-07 NOTE — Assessment & Plan Note (Signed)
[facility]                        [place] CARDIOLOGY OFFICE NOTE   Raven Rios, Raven Rios                       MRN:          [id number]  DATE:12/31/2009                            DOB:          1966-03-24    PROBLEM LIST:  1. Coronary artery disease status post acute myocardial infarction in      September 2010, and failure requiring V-Fib.  She had 3 bare-metal      stents placed in her right coronary artery.  There was a dissection      during the procedure.  Her ejection fraction was 45% at that time.      She has undergone stress test in October 2010 that showed no      ischemia and the ejection fraction 51%.  Her echo dated on September 17, 2009, also showed a normal ejection fraction.  2. Hyperlipidemia.  Lipid profile in November 2010 showed an LDL of      110, HDL of 33, triglycerides of 125, and was on Crestor daily.  3. Hypertension.  4. Ongoing tobacco use.  5. History of migraine headaches.   INTERVAL HISTORY:  The patient states since her last visit, she has been  getting along reasonably well.  She is not attending cardiac rehab, but  attempting to exercise 3 times a day on her exercise bike for which she  states she can tolerate to 20 minutes.  She occasionally has some very  minor chest discomfort, but does not seem to be exacerbated by exercise.  She also states her dyspnea is improving somewhat with the exercise.  She denies any lower extremity edema or syncopal episodes.  She is  compliant with all of her medications.   PHYSICAL EXAMINATION:  VITAL SIGNS:  Today, blood pressure is 121/68,  pulse is 56, sating 97% on room air.  She weighed 191, which is about 15  pounds more than she weighed at the beginning of November.  GENERAL:  No acute distress.  HEENT:  Normocephalic, atraumatic.  NECK:  Supple.  HEART:  Mildly bradycardic, but regular without murmur, rub or gallop.  LUNGS:  Clear bilaterally.  ABDOMEN:  Soft,  nontender, nondistended.  EXTREMITIES:  Without edema.  SKIN:  Warm and dry.  PSYCHIATRIC:  The patient is appropriate.   Review of the patient's lab since her last visit showed a CMP that is  completely within normal limits.  Her cholesterol profile, total  cholesterol was 76, HDL 44, triglycerides 120, LDL 108, that was on  Crestor 40 mg, Zetia 10 mg daily.   ASSESSMENT AND PLAN:  1. Coronary artery disease.  The patient is not having any angina.      She should continue on Effient as she has an ASPIRIN allergy.  She      also to continue her beta-blocker, ACE inhibitor, and statin.  2. Hypertension.  Her blood pressure is under excellent control.  She      is mildly bradycardic today in clinic.  She becomes symptomatic      from the bradycardia.  We could consider decreasing  her beta-      blocker dose.  3. Hyperlipidemia.  The patient is on Crestor 40 mg and Zetia 10 mg.      Her LDL only came down to few points, but this is likely secondary      to a significant weight gain which I am assuming is from dietary      indiscretion that she has had over the past several months.  We      encouraged more dietary modification.  4. ASPIRIN allergy.  Continue on Effient, consider desensitization at      some point in the future.  5. Continued tobacco use.  The patient is using phone counseling-type      system to help with tobacco cessation.  She states that she has      decreased the amount of cigarettes that she was smoking.  We will      see the patient back in 4 months' time.     Brayton El, MD  Electronically Signed    SGA/MedQ  DD: 12/31/2009  DT: 01/01/2010  Job #: [id number]

## 2011-06-29 ENCOUNTER — Other Ambulatory Visit: Payer: Self-pay | Admitting: *Deleted

## 2011-06-29 MED ORDER — LISINOPRIL 20 MG PO TABS: 20.0000 mg | ORAL_TABLET | Freq: Every day | ORAL | Status: AC

## 2011-07-23 ENCOUNTER — Encounter: Payer: Self-pay | Admitting: Cardiovascular Disease

## 2011-08-14 ENCOUNTER — Encounter: Payer: Self-pay | Admitting: Cardiovascular Disease

## 2011-08-17 ENCOUNTER — Ambulatory Visit: Payer: Medicaid Other | Admitting: Cardiovascular Disease

## 2011-08-20 ENCOUNTER — Ambulatory Visit: Payer: Medicaid Other | Admitting: Cardiovascular Disease

## 2011-10-08 ENCOUNTER — Telehealth: Payer: Self-pay | Admitting: Cardiovascular Disease

## 2011-10-08 NOTE — Telephone Encounter (Signed)
All Cardiac faxed to [facility] @ [phone]  10/08/11/km

## 2012-02-11 ENCOUNTER — Other Ambulatory Visit: Payer: Self-pay | Admitting: Cardiovascular Disease

## 2012-04-09 ENCOUNTER — Other Ambulatory Visit: Payer: Self-pay | Admitting: Cardiovascular Disease

## 2014-12-11 ENCOUNTER — Other Ambulatory Visit: Payer: Self-pay | Admitting: Cardiovascular Disease

## 2018-05-19 ENCOUNTER — Encounter: Payer: Self-pay | Admitting: Vascular Surgery

## 2018-06-02 ENCOUNTER — Encounter

## 2018-06-02 ENCOUNTER — Encounter: Payer: Medicaid Other | Admitting: Vascular Surgery

## 2021-03-24 ENCOUNTER — Other Ambulatory Visit: Payer: Self-pay

## 2021-03-24 DIAGNOSIS — I739 Peripheral vascular disease, unspecified: Secondary | ICD-10-CM

## 2021-04-03 ENCOUNTER — Ambulatory Visit (HOSPITAL_COMMUNITY)
Admission: RE | Admit: 2021-04-03 | Discharge: 2021-04-03 | Disposition: A | Discharge status: Home / Self Care | Payer: Medicaid Other | Source: Ambulatory Visit | Attending: Vascular Surgery | Admitting: Vascular Surgery

## 2021-04-03 ENCOUNTER — Other Ambulatory Visit: Payer: Self-pay

## 2021-04-03 ENCOUNTER — Ambulatory Visit (INDEPENDENT_AMBULATORY_CARE_PROVIDER_SITE_OTHER): Payer: Medicaid Other | Admitting: Vascular Surgery

## 2021-04-03 ENCOUNTER — Encounter: Payer: Self-pay | Admitting: Vascular Surgery

## 2021-04-03 VITALS — BP 170/77 | HR 63 | Temp 97.4°F | Resp 16 | Ht 62.0 in | Wt 160.0 lb

## 2021-04-03 DIAGNOSIS — I739 Peripheral vascular disease, unspecified: Secondary | ICD-10-CM

## 2021-04-03 NOTE — Progress Notes (Signed)
Referring Physician: Lonie Peak, PA  Patient name: Raven Rios MRN: [id number] DOB: [birth year] Sex: female  REASON FOR CONSULT: Numbness and tingling in feet rule out peripheral arterial disease  HPI: Raven Rios is a 55 y.o. female, with a 30 to 40-year history of numbness burning tingling in both feet.  Patient states it really has not gotten worse lately but has just been a nuisance to her.  She does not really describe claudication.  She does not describe rest pain.  She does not have any nonhealing wounds.  She does currently smoke 1/2 pack of cigarettes per day.  I discussed with her today smoking cessation for about 3 minutes.  She is on anticholesterol medication Effient and Plavix.  Other medical problems include coronary artery disease and diabetes which are both been stable.  He also has hypertension which is controlled.  Past Medical History:  Diagnosis Date  . Aspirin allergy   . Cardiac arrest - ventricular fibrillation Sept 2010   x 2 enroute to the catherization lab  . Carotid artery occlusion   . Coronary artery disease sept,2010   s/p acute inferior ST elevation MI  . Diabetes mellitus   . Hay fever   . History of migraine headaches   . Hyperlipidemia   . Hypertension   . SOB (shortness of breath)   . Tobacco user    Past Surgical History:  Procedure Laterality Date  . ADENOIDECTOMY    . CARDIAC CATHETERIZATION  Sept 2010, Aug 2011   Most recent cath showed an occluded RCA at previously placed stents.   . CHOLECYSTECTOMY    . CORONARY ANGIOPLASTY  07/2009   EF of 45%-underwent 3 bare-metal stent placements  . TONSILLECTOMY      Family History  Problem Relation Age of Onset  . Hypertension Mother   . Brain cancer Father 73  . Heart attack Brother 49  . Cancer Maternal Aunt        breast  . Cancer Paternal Aunt        breast  . Cancer Maternal Grandmother        breast    SOCIAL HISTORY: Social History   Socioeconomic History  . Marital  status: Divorced    Spouse name: Not on file  . Number of children: Not on file  . Years of education: Not on file  . Highest education level: Not on file  Occupational History  . Not on file  Tobacco Use  . Smoking status: Current Every Day Smoker    Packs/day: 0.50    Types: Cigarettes  . Smokeless tobacco: Never Used  Vaping Use  . Vaping Use: Never used  Substance and Sexual Activity  . Alcohol use: No  . Drug use: No  . Sexual activity: Not on file  Other Topics Concern  . Not on file  Social History Narrative  . Not on file   Social Determinants of Health   Financial Resource Strain: Not on file  Food Insecurity: Not on file  Transportation Needs: Not on file  Physical Activity: Not on file  Stress: Not on file  Social Connections: Not on file  Intimate Partner Violence: Not on file    Allergies  Allergen Reactions  . Aspirin Hives  . Adult Strength Aspirin [Aspirin-Caffeine] Hives  . Penicillins     Current Outpatient Medications  Medication Sig Dispense Refill  . Albuterol Sulfate (VENTOLIN HFA IN) Inhale 2 puffs into the lungs daily.    Marland Kitchen amitriptyline (  ELAVIL) 25 MG tablet Take 25 mg by mouth at bedtime. To take 1-2 tablets every night for sleep.    Marland Kitchen amLODipine (NORVASC) 5 MG tablet Take 5 mg by mouth daily.    Marland Kitchen aspirin-acetaminophen-caffeine (EXCEDRIN MIGRAINE) 250-250-65 MG per tablet Take 1 tablet by mouth every 6 (six) hours as needed.    . budesonide-formoterol (SYMBICORT) 160-4.5 MCG/ACT inhaler Inhale 2 puffs into the lungs 2 (two) times daily.    Marland Kitchen buPROPion (WELLBUTRIN XL) 150 MG 24 hr tablet Take 150 mg by mouth daily.    . butalbital-acetaminophen-caffeine (FIORICET WITH CODEINE) 50-325-40-30 MG per capsule Take 1 capsule by mouth every 4 (four) hours as needed.    . chlorpheniramine (CHLOR-TRIMETON) 4 MG tablet Take 4 mg by mouth as needed.    . clopidogrel (PLAVIX) 75 MG tablet Take 75 mg by mouth daily.    . cyclobenzaprine (FLEXERIL) 10  MG tablet Take 10 mg by mouth 3 (three) times daily as needed.    . ezetimibe (ZETIA) 10 MG tablet Take 10 mg by mouth daily.    . Fluticasone-Salmeterol (ADVAIR) 250-50 MCG/DOSE AEPB Inhale 1 puff into the lungs every 12 (twelve) hours.    . Glucose Blood (ACCU-CHEK AVIVA VI) by In Vitro route.    Marland Kitchen glucose blood test strip 1 each by Other route as needed for other. Use as instructed    . hydrOXYzine (ATARAX/VISTARIL) 25 MG tablet Take 25 mg by mouth every 6 (six) hours as needed.    Marland Kitchen lisinopril (PRINIVIL,ZESTRIL) 20 MG tablet Take 1 tablet (20 mg total) by mouth daily. 30 tablet 2  . medroxyPROGESTERone (DEPO-PROVERA) 150 MG/ML injection Inject 150 mg into the muscle every 3 (three) months.    . metoprolol (LOPRESSOR) 50 MG tablet Take 50 mg by mouth 2 (two) times daily. To take 11/2 tablets twice daily.    . mupirocin ointment (BACTROBAN) 2 % Place 1 application into the nose daily.    . nicotine (NICODERM CQ - DOSED IN MG/24 HOURS) 14 mg/24hr patch Place 14 mg onto the skin daily.    Marland Kitchen NITROSTAT 0.4 MG SL tablet DISSOLVE 1 TABLET UNDER THE TONGUE EVERY 5 MINUTES TIMES 3 AS NEEDED FOR PAIN 25 tablet 0  . Omega-3 Fatty Acids (FISH OIL) 500 MG CAPS Take by mouth.    Marland Kitchen omeprazole (PRILOSEC) 40 MG capsule Take 40 mg by mouth daily.    . prasugrel (EFFIENT) 10 MG TABS tablet Take 10 mg by mouth daily.    . pravastatin (PRAVACHOL) 80 MG tablet Take 80 mg by mouth daily.    . rosuvastatin (CRESTOR) 40 MG tablet Take 40 mg by mouth daily.    . traMADol (ULTRAM) 50 MG tablet Take 50 mg by mouth 2 (two) times daily as needed. To take 1-2 tablets prn twice a day.    . varenicline (CHANTIX PAK) 0.5 MG X 11 & 1 MG X 42 tablet Take by mouth 2 (two) times daily. Take one 0.5 mg tablet by mouth once daily for 3 days, then increase to one 0.5 mg tablet twice daily for 4 days, then increase to one 1 mg tablet twice daily.    Marland Kitchen venlafaxine (EFFEXOR) 75 MG tablet Take 75 mg by mouth daily. To take 4 tablets every  day for depression    . Vitamin D, Cholecalciferol, 400 units TABS Take by mouth.     No current facility-administered medications for this visit.    ROS:   General:  No weight loss,  Fever, chills  HEENT: No recent headaches, no nasal bleeding, no visual changes, no sore throat  Neurologic: No dizziness, blackouts, seizures. No recent symptoms of stroke or mini- stroke. No recent episodes of slurred speech, or temporary blindness.  Cardiac: No recent episodes of chest pain/pressure, no shortness of breath at rest.  + shortness of breath with exertion.  Denies history of atrial fibrillation or irregular heartbeat  Vascular: No history of rest pain in feet.  No history of claudication.  No history of non-healing ulcer, No history of DVT   Pulmonary: No home oxygen, no productive cough, no hemoptysis,  No asthma or wheezing  Musculoskeletal:  [ ]  Arthritis, [ ]  Low back pain,  [ ]  Joint pain  Hematologic:No history of hypercoagulable state.  No history of easy bleeding.  No history of anemia  Gastrointestinal: No hematochezia or melena,  No gastroesophageal reflux, no trouble swallowing  Urinary: [ ]  chronic Kidney disease, [ ]  on HD - [ ]  MWF or [ ]  TTHS, [ ]  Burning with urination, [ ]  Frequent urination, [ ]  Difficulty urinating;   Skin: No rashes  Psychological: No history of anxiety,  No history of depression   Physical Examination  Vitals:   04/03/21 1339  BP: (!) 170/77  Pulse: 63  Resp: 16  Temp: (!) 97.4 F (36.3 C)  SpO2: 96%  Weight: 160 lb (72.6 kg)  Height: 5\' 2"  (1.575 m)    Body mass index is 29.26 kg/m.  General:  Alert and oriented, no acute distress HEENT: Normal Neck: No JVD Cardiac: Regular Rate and Rhythm Skin: Multiple areas of scarring and healing calf area bilaterally consistent with prior areas of scratching and itching bilateral lower extremities Extremity Pulses:  2+ radial, brachial, femoral, 2+ right absent left dorsalis pedis, absent  posterior tibial pulses bilaterally Musculoskeletal: No deformity or edema  Neurologic: Upper and lower extremity motor 5/5 and symmetric  DATA:  Patient had bilateral ABIs performed in our office today.  I reviewed and interpreted these.  Right side was 0.98 with triphasic waveforms left was 0.7 with monophasic waveforms  ASSESSMENT: Peripheral arterial disease primary left leg.  Discussed with patient and her daughter today that she has reasonable flow getting to both lower extremities and she is not currently at risk of limb loss.  However I did discuss with her that if she continues to smoke she may be at risk of limb loss down the road.  We discussed that as long as she has an ABI greater than 0.5 she should have reasonable flow to her left leg.  We also discussed that most of her symptoms are probably related to peripheral neuropathy the numbness tingling burning sensation.  I do not know the etiology for her neuropathy as this can have multiple causes but she has had the symptoms according to her for more than 40 years so I do not believe that they are related to her arterial disease.   PLAN: Patient was counseled on protecting her feet and skin since she does not have normal sensation secondary to neuropathy.  Patient was advised to quit smoking to preserve flow to her lower extremities as she does have mild to moderate peripheral arterial disease especially in the left leg.  Patient will follow up in our APP clinic in 6 months with repeat ABIs.   , MD [facility]: [phone]

## 2021-04-07 ENCOUNTER — Other Ambulatory Visit: Payer: Self-pay | Admitting: *Deleted

## 2021-04-07 DIAGNOSIS — I739 Peripheral vascular disease, unspecified: Secondary | ICD-10-CM

## 2023-08-30 DIAGNOSIS — Z08 Encounter for follow-up examination after completed treatment for malignant neoplasm: Secondary | ICD-10-CM | POA: Diagnosis not present

## 2023-08-30 DIAGNOSIS — L905 Scar conditions and fibrosis of skin: Secondary | ICD-10-CM | POA: Diagnosis not present

## 2023-08-30 DIAGNOSIS — Z85820 Personal history of malignant melanoma of skin: Secondary | ICD-10-CM | POA: Diagnosis not present

## 2023-09-02 DIAGNOSIS — L905 Scar conditions and fibrosis of skin: Secondary | ICD-10-CM | POA: Diagnosis not present

## 2023-09-28 DIAGNOSIS — Z85820 Personal history of malignant melanoma of skin: Secondary | ICD-10-CM | POA: Diagnosis not present

## 2023-09-28 DIAGNOSIS — C4359 Malignant melanoma of other part of trunk: Secondary | ICD-10-CM | POA: Diagnosis not present

## 2023-09-29 DIAGNOSIS — D485 Neoplasm of uncertain behavior of skin: Secondary | ICD-10-CM | POA: Diagnosis not present

## 2023-09-29 DIAGNOSIS — L02821 Furuncle of head [any part, except face]: Secondary | ICD-10-CM | POA: Diagnosis not present

## 2023-09-29 DIAGNOSIS — L821 Other seborrheic keratosis: Secondary | ICD-10-CM | POA: Diagnosis not present

## 2023-09-29 DIAGNOSIS — L720 Epidermal cyst: Secondary | ICD-10-CM | POA: Diagnosis not present

## 2023-10-01 DIAGNOSIS — Z85820 Personal history of malignant melanoma of skin: Secondary | ICD-10-CM | POA: Diagnosis not present

## 2023-10-01 DIAGNOSIS — G8918 Other acute postprocedural pain: Secondary | ICD-10-CM | POA: Diagnosis not present

## 2023-12-01 DIAGNOSIS — L905 Scar conditions and fibrosis of skin: Secondary | ICD-10-CM | POA: Diagnosis not present

## 2023-12-01 DIAGNOSIS — L0212 Furuncle of neck: Secondary | ICD-10-CM | POA: Diagnosis not present

## 2023-12-01 DIAGNOSIS — L720 Epidermal cyst: Secondary | ICD-10-CM | POA: Diagnosis not present

## 2023-12-01 DIAGNOSIS — L02821 Furuncle of head [any part, except face]: Secondary | ICD-10-CM | POA: Diagnosis not present

## 2024-09-20 ENCOUNTER — Ambulatory Visit

## 2024-09-20 DIAGNOSIS — S82832A Other fracture of upper and lower end of left fibula, initial encounter for closed fracture: Secondary | ICD-10-CM | POA: Diagnosis not present

## 2024-09-20 DIAGNOSIS — S9032XA Contusion of left foot, initial encounter: Secondary | ICD-10-CM

## 2024-09-20 NOTE — Progress Notes (Signed)
 Subjective:  Patient ID: Raven Rios, female    DOB: 1966/03/30,  MRN: [id number]  No chief complaint on file.   Discussed the use of AI scribe software for clinical note transcription with the patient, who gave verbal consent to proceed.  History of Present Illness Raven Rios is a 58 year old female who presents with a fractured fibula following a fall.  On October 24, she fell while moving items, experiencing a 'funny feeling' in her eyes and difficulty breathing before losing consciousness. Her children found her with her leg in an unusual position. She has significant pain in the ankle, especially with pressure, and pain radiating upwards.  She uses a lidocaine patch for temporary relief, which causes tingling in her foot. She takes Tylenol and Aleve for pain management but is allergic to ibuprofen.   She has been WB[employer] in [employer] since the injury. She states that she and her family were recently evicted from their house.   Patient has extensive cigarette smoking history and still smokes now.     Review of Systems: Negative except as noted in the HPI. Denies N/V/F/Ch.  Past Medical History:  Diagnosis Date   Aspirin allergy    Cardiac arrest - ventricular fibrillation Sept 2010   x 2 enroute to the catherization lab   Carotid artery occlusion    Coronary artery disease sept,2010   s/p acute inferior ST elevation MI   Diabetes mellitus    Hay fever    History of migraine headaches    Hyperlipidemia    Hypertension    SOB (shortness of breath)    Tobacco user     Current Outpatient Medications:    Albuterol Sulfate (VENTOLIN HFA IN), Inhale 2 puffs into the lungs daily., Disp: , Rfl:    amitriptyline (ELAVIL) 25 MG tablet, Take 25 mg by mouth at bedtime. To take 1-2 tablets every night for sleep., Disp: , Rfl:    amLODipine (NORVASC) 5 MG tablet, Take 5 mg by mouth daily., Disp: , Rfl:    aspirin-acetaminophen-caffeine (EXCEDRIN MIGRAINE) 250-250-65 MG  per tablet, Take 1 tablet by mouth every 6 (six) hours as needed., Disp: , Rfl:    budesonide-formoterol (SYMBICORT) 160-4.5 MCG/ACT inhaler, Inhale 2 puffs into the lungs 2 (two) times daily., Disp: , Rfl:    buPROPion (WELLBUTRIN XL) 150 MG 24 hr tablet, Take 150 mg by mouth daily., Disp: , Rfl:    butalbital-acetaminophen-caffeine (FIORICET WITH CODEINE) 50-325-40-30 MG per capsule, Take 1 capsule by mouth every 4 (four) hours as needed., Disp: , Rfl:    chlorpheniramine (CHLOR-TRIMETON) 4 MG tablet, Take 4 mg by mouth as needed., Disp: , Rfl:    clopidogrel (PLAVIX) 75 MG tablet, Take 75 mg by mouth daily., Disp: , Rfl:    cyclobenzaprine (FLEXERIL) 10 MG tablet, Take 10 mg by mouth 3 (three) times daily as needed., Disp: , Rfl:    ezetimibe (ZETIA) 10 MG tablet, Take 10 mg by mouth daily., Disp: , Rfl:    Fluticasone-Salmeterol (ADVAIR) 250-50 MCG/DOSE AEPB, Inhale 1 puff into the lungs every 12 (twelve) hours., Disp: , Rfl:    Glucose Blood (ACCU-CHEK AVIVA VI), by In Vitro route., Disp: , Rfl:    glucose blood test strip, 1 each by Other route as needed for other. Use as instructed, Disp: , Rfl:    hydrOXYzine (ATARAX/VISTARIL) 25 MG tablet, Take 25 mg by mouth every 6 (six) hours as needed., Disp: , Rfl:    lisinopril  (PRINIVIL ,ZESTRIL ) 20 MG tablet,  Take 1 tablet (20 mg total) by mouth daily., Disp: 30 tablet, Rfl: 2   medroxyPROGESTERone (DEPO-PROVERA) 150 MG/ML injection, Inject 150 mg into the muscle every 3 (three) months., Disp: , Rfl:    metoprolol (LOPRESSOR) 50 MG tablet, Take 50 mg by mouth 2 (two) times daily. To take 11/2 tablets twice daily., Disp: , Rfl:    mupirocin ointment (BACTROBAN) 2 %, Place 1 application into the nose daily., Disp: , Rfl:    nicotine (NICODERM CQ - DOSED IN MG/24 HOURS) 14 mg/24hr patch, Place 14 mg onto the skin daily., Disp: , Rfl:    NITROSTAT 0.4 MG SL tablet, DISSOLVE 1 TABLET UNDER THE TONGUE EVERY 5 MINUTES TIMES 3 AS NEEDED FOR PAIN, Disp: 25  tablet, Rfl: 0   Omega-3 Fatty Acids (FISH OIL) 500 MG CAPS, Take by mouth., Disp: , Rfl:    omeprazole (PRILOSEC) 40 MG capsule, Take 40 mg by mouth daily., Disp: , Rfl:    prasugrel (EFFIENT) 10 MG TABS tablet, Take 10 mg by mouth daily., Disp: , Rfl:    pravastatin (PRAVACHOL) 80 MG tablet, Take 80 mg by mouth daily., Disp: , Rfl:    rosuvastatin (CRESTOR) 40 MG tablet, Take 40 mg by mouth daily., Disp: , Rfl:    traMADol (ULTRAM) 50 MG tablet, Take 50 mg by mouth 2 (two) times daily as needed. To take 1-2 tablets prn twice a day., Disp: , Rfl:    varenicline (CHANTIX PAK) 0.5 MG X 11 & 1 MG X 42 tablet, Take by mouth 2 (two) times daily. Take one 0.5 mg tablet by mouth once daily for 3 days, then increase to one 0.5 mg tablet twice daily for 4 days, then increase to one 1 mg tablet twice daily., Disp: , Rfl:    venlafaxine (EFFEXOR) 75 MG tablet, Take 75 mg by mouth daily. To take 4 tablets every day for depression, Disp: , Rfl:    Vitamin D, Cholecalciferol, 400 units TABS, Take by mouth., Disp: , Rfl:   Social History   Tobacco Use  Smoking Status Every Day   Current packs/day: 0.50   Types: Cigarettes  Smokeless Tobacco Never    Allergies  Allergen Reactions   Aspirin Hives   Adult Strength Aspirin [Aspirin-Caffeine] Hives   Penicillins    Objective:   Constitutional Well developed. Well nourished. Oriented to person, place, and time.  Vascular Dorsalis pedis pulses non-palpable bilaterally. Posterior tibial pulses non-palpable bilaterally. Capillary refill normal to all digits.  No cyanosis or clubbing noted. Pedal hair growth normal.  Neurologic Normal speech. Epicritic sensation to light touch grossly normal bilaterally. Negative tinel sign at tarsal tunnel bilaterally.   Dermatologic Skin texture and turgor are within normal limits.  No open wounds. No skin lesions.  Musculoskeletal: Pain to palpation of distal fibula.  No pain with palpation of medial malleolus,  posterior malleolus, anterior ankle.  Mild pain with tib-fib squeeze.  Moderate ecchymosis and edema.    Radiographs: Taken and reviewed.  2 views of ankle, 3 views of left foot.  These demonstrate a short oblique distal fibular fracture at level of syndesmosis with minimal posterior displacement.  No syndesmotic widening.  No medial malleoli are fracture or posterior malleolus fracture identified.  No other acute fractures or osseous pathology identified.   Assessment:   1. Other closed fracture of distal end of left fibula, initial encounter   2. Contusion of left foot, initial encounter      Plan:  Patient was evaluated  and treated and all questions answered.  Assessment & Plan Left fibular fracture Due to minor displacement, fracture pattern borderline for surgery. Nonoperative treatment chosen due to smoking-related and current social situation at home. Smoking impairs bone healing, risking nonunion and arthritis. - Fitted for and dispensed CAM walker boot, treat as cast keeping it on consistently. Non-weight bearing status. Recommend knee scooter, wheelchair, or crutches. Okay to begin weight bearing at week 5, let pain be guide and decrease activity if it becomes sore or swollen.  - Encourage smoking cessation or reduction at minimum. - Advise elevation and compression to reduce swelling. - Recommend icing if swollen. - Suggest vitamin D supplementation OTC. - Schedule follow-up in six weeks with repeat XR. - RTC if pathology changes    Return in about 6 weeks (around 11/01/2024).   Prentice Ovens, DPM AACFAS Fellowship Trained Podiatric Surgeon [facility]

## 2024-10-31 ENCOUNTER — Ambulatory Visit
